# Patient Record
Sex: Male | Born: 1990 | Race: Black or African American | Hispanic: No | Marital: Single | Smoking: Current some day smoker
Health system: Southern US, Community
[De-identification: ages and names within clinical notes are randomized; demographics above are authoritative.]

## PROBLEM LIST (undated history)

## (undated) DIAGNOSIS — R569 Unspecified convulsions: Secondary | ICD-10-CM

---

## 2019-12-06 ENCOUNTER — Emergency Department: Payer: Self-pay

## 2019-12-06 ENCOUNTER — Encounter: Payer: Self-pay | Admitting: Emergency Medicine

## 2019-12-06 ENCOUNTER — Emergency Department
Admission: EM | Admit: 2019-12-06 | Discharge: 2019-12-06 | Disposition: A | Discharge status: Home / Self Care | Payer: Self-pay | Attending: Emergency Medicine | Admitting: Emergency Medicine

## 2019-12-06 ENCOUNTER — Other Ambulatory Visit: Payer: Self-pay

## 2019-12-06 DIAGNOSIS — X58XXXA Exposure to other specified factors, initial encounter: Secondary | ICD-10-CM | POA: Insufficient documentation

## 2019-12-06 DIAGNOSIS — Y999 Unspecified external cause status: Secondary | ICD-10-CM | POA: Insufficient documentation

## 2019-12-06 DIAGNOSIS — Y929 Unspecified place or not applicable: Secondary | ICD-10-CM | POA: Insufficient documentation

## 2019-12-06 DIAGNOSIS — Y9389 Activity, other specified: Secondary | ICD-10-CM | POA: Insufficient documentation

## 2019-12-06 DIAGNOSIS — S39012A Strain of muscle, fascia and tendon of lower back, initial encounter: Secondary | ICD-10-CM | POA: Insufficient documentation

## 2019-12-06 HISTORY — DX: Unspecified convulsions: R56.9

## 2019-12-06 MED ORDER — OXYCODONE-ACETAMINOPHEN 5-325 MG PO TABS: 1.0000 | ORAL_TABLET | Freq: Four times a day (QID) | ORAL | 0 refills | Status: DC | PRN

## 2019-12-06 MED ORDER — HYDROCODONE-ACETAMINOPHEN 5-325 MG PO TABS
1.0000 | ORAL_TABLET | Freq: Once | ORAL | Status: AC
  Administered 2019-12-06: 13:00:00 1 via ORAL
  Filled 2019-12-06: qty 1

## 2019-12-06 MED ORDER — METHOCARBAMOL 750 MG PO TABS: 750.0000 mg | ORAL_TABLET | Freq: Four times a day (QID) | ORAL | 0 refills | Status: DC

## 2019-12-06 MED ORDER — NAPROXEN 500 MG PO TABS: 500.0000 mg | ORAL_TABLET | Freq: Two times a day (BID) | ORAL | 0 refills | Status: DC

## 2019-12-06 MED ORDER — KETOROLAC TROMETHAMINE 30 MG/ML IJ SOLN
30.0000 mg | Freq: Once | INTRAMUSCULAR | Status: AC
  Administered 2019-12-06: 30 mg via INTRAMUSCULAR
  Filled 2019-12-06: qty 1

## 2019-12-06 MED ORDER — METHOCARBAMOL 500 MG PO TABS
1000.0000 mg | ORAL_TABLET | Freq: Once | ORAL | Status: AC
  Administered 2019-12-06: 1000 mg via ORAL
  Filled 2019-12-06: qty 2

## 2019-12-06 NOTE — ED Provider Notes (Signed)
[facility] Emergency Department Provider Note  ___________________________________________   First MD Initiated Contact with Patient 12/06/19 1133     (approximate)  I have reviewed the triage vital signs and the nursing notes.   HISTORY  Chief Complaint Back Pain  HPI Ryan Randolph is a 29 y.o. male presents to the ED with complaint of low back pain.  Patient states that he was at work and sneeze when his back suddenly began hurting.  He reports  previous problems with his back but no surgery.  He has not taken any over-the-counter medication.  He denies any saddle anesthesias or l incontinence of bowel or bladder.  Patient has continued to ambulate without any assistance.  Patient rates his pain as a 9 out of 10.     Past Medical History:  Diagnosis Date  . Seizures (HCC)     There are no problems to display for this patient.   History reviewed. No pertinent surgical history.  Prior to Admission medications   Medication Sig Start Date End Date Taking? Authorizing Provider  methocarbamol (ROBAXIN-750) 750 MG tablet Take 1 tablet (750 mg total) by mouth 4 (four) times daily. 12/06/19   Tommi Rumps, PA-C  naproxen (NAPROSYN) 500 MG tablet Take 1 tablet (500 mg total) by mouth 2 (two) times daily with a meal. 12/06/19   Tommi Rumps, PA-C  oxyCODONE-acetaminophen (PERCOCET) 5-325 MG tablet Take 1 tablet by mouth every 6 (six) hours as needed for severe pain. 12/06/19   Tommi Rumps, PA-C    Allergies Patient has no allergy information on record.  No family history on file.  Social History Social History   Tobacco Use  . Smoking status: Not on file  Substance Use Topics  . Alcohol use: Not on file  . Drug use: Not on file    Review of Systems Constitutional: No fever/chills Cardiovascular: Denies chest pain. Respiratory: Denies shortness of breath. Gastrointestinal: No abdominal pain.  No nausea, no vomiting. Genitourinary:  Negative for dysuria. Musculoskeletal: Positive for low back pain.  Negative for previous back surgeries. Skin: Negative for rash. Neurological: Negative for headaches, focal weakness or numbness.  ____________________________________________   PHYSICAL EXAM:  VITAL SIGNS: ED Triage Vitals  Enc Vitals Group     BP 12/06/19 1042 133/79     Pulse Rate 12/06/19 1042 63     Resp 12/06/19 1042 20     Temp 12/06/19 1042 97.8 F (36.6 C)     Temp Source 12/06/19 1042 Oral     SpO2 12/06/19 1042 100 %     Weight 12/06/19 1044 260 lb (117.9 kg)     Height 12/06/19 1044 5\' 9"  (1.753 m)     Head Circumference --      Peak Flow --      Pain Score 12/06/19 1044 9     Pain Loc --      Pain Edu? --      Excl. in GC? --     Constitutional: Alert and oriented. Well appearing and in no acute distress. Eyes: Conjunctivae are normal.  Head: Atraumatic. Neck: No stridor.   Cardiovascular: Normal rate, regular rhythm. Grossly normal heart sounds.  Good peripheral circulation. Respiratory: Normal respiratory effort.  No retractions. Lungs CTAB. Musculoskeletal: On examination of the back there is no gross deformity.  There is diffuse tenderness on palpation of the lower lumbar and sacral area bilaterally.  Range of motion is restricted secondary to discomfort.  Good muscle  strength bilaterally.  Straight leg raises were difficult secondary to discomfort.  Patient is able to weight-bear and ambulate without any assistance. Neurologic:  Normal speech and language. No gross focal neurologic deficits are appreciated. No gait instability. Skin:  Skin is warm, dry and intact. No rash noted.  No skin discoloration. Psychiatric: Mood and affect are normal. Speech and behavior are normal.  ____________________________________________   LABS (all labs ordered are listed, but only abnormal results are displayed)  Labs Reviewed - No data to display  RADIOLOGY  Official radiology report(s): DG Lumbar  Spine 2-3 Views  Result Date: 12/06/2019 CLINICAL DATA:  Pain EXAM: LUMBAR SPINE - 2-3 VIEW COMPARISON:  None. FINDINGS: There is no evidence of lumbar spine fracture. Mild disc space height loss and osteophytosis at L4-L5 and L5-S1, with otherwise preserved disc spaces. Intervertebral disc spaces are maintained. IMPRESSION: No fracture or dislocation of the lumbar spine. Mild disc space height loss and osteophytosis at L4-L5 and L5-S1, with otherwise preserved disc spaces. Lumbar disc and neural foraminal pathology may be further evaluated by MRI if indicated by localizing signs and symptoms. Electronically Signed   By: Eddie Candle M.D.   On: 12/06/2019 14:09    ____________________________________________   PROCEDURES  Procedure(s) performed (including Critical Care):  Procedures   ____________________________________________   INITIAL IMPRESSION / ASSESSMENT AND PLAN / ED COURSE  As part of my medical decision making, I reviewed the following data within the electronic MEDICAL RECORD NUMBER Notes from prior ED visits and [place] Controlled Substance Database  29 year old male presents to the ED with complaint of lower back pain after he sneezed at work today.  Patient patient states he has had problems with his low back in the past but has not had any surgeries or orthopedic care.  Patient continues to ambulate without any assistance.  Physical exam was consistent with a muscle skeletal strain.  Patient was given Toradol 30 mg IM, Robaxin 1000 mg p.o. and Norco 5/325 1 p.o. while in the ED.  X-rays were negative for any acute changes.  Patient was discharged with a prescription for methocarbamol 750 mg 1 tablet 4 times daily, naproxen 500 mg twice daily and Percocet 5/325.  Patient is to use ice or heat to his back as needed for discomfort.  He is to follow-up with Dr. Posey Pronto if any continued problems.  ____________________________________________   FINAL CLINICAL IMPRESSION(S) / ED  DIAGNOSES  Final diagnoses:  Strain of lumbar region, initial encounter     ED Discharge Orders         Ordered    oxyCODONE-acetaminophen (PERCOCET) 5-325 MG tablet  Every 6 hours PRN     12/06/19 1419    methocarbamol (ROBAXIN-750) 750 MG tablet  4 times daily     12/06/19 1419    naproxen (NAPROSYN) 500 MG tablet  2 times daily with meals     12/06/19 1419           Note:  This document was prepared using Dragon voice recognition software and may include unintentional dictation errors.    Johnn Hai, PA-C 12/06/19 1700    Duffy Bruce, MD 12/06/19 2030

## 2019-12-06 NOTE — Discharge Instructions (Signed)
Follow-up with 1 the clinics listed on your discharge papers.  Begin taking medication as directed.  You had the first medications in the ED.  Be aware that both medications may cause drowsiness and increase your risk for fall or injury.  Also Dr. Allena Katz is listed on your discharge papers who is the orthopedist on call if you are not improving.  You may use ice or heat to your muscles as needed for discomfort.

## 2019-12-06 NOTE — ED Notes (Signed)
Says he was at work and sneezed and then his back hurt. Low back all the way across without radiation.

## 2019-12-06 NOTE — ED Triage Notes (Signed)
Pt reports was at work, sneezed and hurt his lower back when he did. Pt denies loss of bowel or bladder. Pt does reports a hx of back pain.

## 2020-03-05 ENCOUNTER — Telehealth: Payer: Self-pay | Admitting: General Practice

## 2020-03-05 NOTE — Telephone Encounter (Signed)
Individual has been contacted 3+ times regarding ED referral. No further attempts to contact individual will be made. 

## 2021-05-04 ENCOUNTER — Emergency Department
Admission: EM | Admit: 2021-05-04 | Discharge: 2021-05-04 | Disposition: A | Discharge status: Home / Self Care | Payer: Self-pay | Attending: Emergency Medicine | Admitting: Emergency Medicine

## 2021-05-04 ENCOUNTER — Other Ambulatory Visit: Payer: Self-pay

## 2021-05-04 DIAGNOSIS — M79605 Pain in left leg: Secondary | ICD-10-CM | POA: Insufficient documentation

## 2021-05-04 DIAGNOSIS — Z5321 Procedure and treatment not carried out due to patient leaving prior to being seen by health care provider: Secondary | ICD-10-CM | POA: Insufficient documentation

## 2021-05-04 NOTE — ED Triage Notes (Signed)
Pt comes pov with left leg pain. States he gets pinched nerves in his legs sometimes from discs in his back.

## 2021-05-15 ENCOUNTER — Emergency Department: Payer: Self-pay

## 2021-05-15 ENCOUNTER — Emergency Department
Admission: EM | Admit: 2021-05-15 | Discharge: 2021-05-15 | Disposition: A | Discharge status: Home / Self Care | Payer: Self-pay | Attending: Emergency Medicine | Admitting: Emergency Medicine

## 2021-05-15 ENCOUNTER — Other Ambulatory Visit: Payer: Self-pay

## 2021-05-15 ENCOUNTER — Encounter: Payer: Self-pay | Admitting: Emergency Medicine

## 2021-05-15 DIAGNOSIS — M79605 Pain in left leg: Secondary | ICD-10-CM

## 2021-05-15 DIAGNOSIS — M79662 Pain in left lower leg: Secondary | ICD-10-CM | POA: Insufficient documentation

## 2021-05-15 MED ORDER — ONDANSETRON 4 MG PO TBDP
4.0000 mg | ORAL_TABLET | Freq: Once | ORAL | Status: AC
  Administered 2021-05-15: 4 mg via ORAL
  Filled 2021-05-15: qty 1

## 2021-05-15 MED ORDER — IBUPROFEN 800 MG PO TABS: 800.0000 mg | ORAL_TABLET | Freq: Three times a day (TID) | ORAL | 0 refills | Status: DC | PRN

## 2021-05-15 MED ORDER — OXYCODONE-ACETAMINOPHEN 5-325 MG PO TABS
2.0000 | ORAL_TABLET | Freq: Once | ORAL | Status: AC
  Administered 2021-05-15: 2 via ORAL
  Filled 2021-05-15: qty 2

## 2021-05-15 MED ORDER — PREDNISONE 10 MG (21) PO TBPK: ORAL_TABLET | ORAL | 0 refills | Status: DC

## 2021-05-15 MED ORDER — ONDANSETRON 4 MG PO TBDP: 4.0000 mg | ORAL_TABLET | Freq: Four times a day (QID) | ORAL | 0 refills | Status: DC | PRN

## 2021-05-15 MED ORDER — OXYCODONE-ACETAMINOPHEN 5-325 MG PO TABS: 1.0000 | ORAL_TABLET | Freq: Four times a day (QID) | ORAL | 0 refills | Status: DC | PRN

## 2021-05-15 NOTE — ED Provider Notes (Signed)
[facility] Emergency Department Provider Note  ____________________________________________   Event Date/Time   First MD Initiated Contact with Patient 05/15/21 [id number]     (approximate)  I have reviewed the triage vital signs and the nursing notes.   HISTORY  Chief Complaint Leg Pain    HPI Ryan Randolph is a 30 y.o. male with history of seizures who presents to the emergency department complaints of left calf pain that is sharp, severe in nature for the past 2 weeks.  States is just progressively getting worse.  No injury that he can recall.  States pain is worse with walking.  Denies numbness, tingling or weakness.  No fever.  No chest pain or shortness of breath.  No bowel or bladder incontinence.  No urinary retention.  No back pain.  No history of PE or DVT.  Has had a previous "pinched nerve" in his back but states this feels different.  Does not have a primary care provider.  Has tried Tylenol at home without relief.         Past Medical History:  Diagnosis Date   Seizures (HCC)     There are no problems to display for this patient.   History reviewed. No pertinent surgical history.  Prior to Admission medications   Medication Sig Start Date End Date Taking? Authorizing Provider  ibuprofen (ADVIL) 800 MG tablet Take 1 tablet (800 mg total) by mouth every 8 (eight) hours as needed for mild pain. 05/15/21  Yes Tallie Dodds, Baxter Hire N, DO  ondansetron (ZOFRAN ODT) 4 MG disintegrating tablet Take 1 tablet (4 mg total) by mouth every 6 (six) hours as needed for nausea or vomiting. 05/15/21  Yes Mohamud Mrozek, Layla Maw, DO  oxyCODONE-acetaminophen (PERCOCET) 5-325 MG tablet Take 1 tablet by mouth every 6 (six) hours as needed for severe pain. 05/15/21 05/15/22 Yes Lukis Bunt, Layla Maw, DO  predniSONE (STERAPRED UNI-PAK 21 TAB) 10 MG (21) TBPK tablet Take as directed 05/15/21  Yes Hend Mccarrell, Layla Maw, DO    Allergies Patient has no known allergies.  No family history on  file.  Social History    Review of Systems Constitutional: No fever. Eyes: No visual changes. ENT: No sore throat. Cardiovascular: Denies chest pain. Respiratory: Denies shortness of breath. Gastrointestinal: No nausea, vomiting, diarrhea. Genitourinary: Negative for dysuria. Musculoskeletal: Negative for back pain. Skin: Negative for rash. Neurological: Negative for focal weakness or numbness.  ____________________________________________   PHYSICAL EXAM:  VITAL SIGNS: ED Triage Vitals  Enc Vitals Group     BP 05/15/21 0401 (!) 133/91     Pulse Rate 05/15/21 0401 82     Resp 05/15/21 0401 18     Temp 05/15/21 0401 98.5 F (36.9 C)     Temp Source 05/15/21 0401 Oral     SpO2 05/15/21 0401 97 %     Weight 05/15/21 0402 230 lb (104.3 kg)     Height 05/15/21 0402 5\' 9"  (1.753 m)     Head Circumference --      Peak Flow --      Pain Score 05/15/21 0401 10     Pain Loc --      Pain Edu? --      Excl. in GC? --    CONSTITUTIONAL: Alert and oriented and responds appropriately to questions. Well-appearing; well-nourished HEAD: Normocephalic EYES: Conjunctivae clear, pupils appear equal, EOM appear intact ENT: normal nose; moist mucous membranes NECK: Supple, normal ROM CARD: RRR; S1 and S2 appreciated; no murmurs, no clicks,  no rubs, no gallops RESP: Normal chest excursion without splinting or tachypnea; breath sounds clear and equal bilaterally; no wheezes, no rhonchi, no rales, no hypoxia or respiratory distress, speaking full sentences ABD/GI: Normal bowel sounds; non-distended; soft, non-tender, no rebound, no guarding, no peritoneal signs, no hepatosplenomegaly BACK: The back appears normal, no midline spinal tenderness or step-off or deformity EXT: Patient is tender to palpation in the left posterior calf without swelling.  2+ DP pulses bilaterally.  No joint effusion, redness or warmth noted.  Compartments soft.  No tenderness over the left hip, knee, ankle or  foot. SKIN: Normal color for age and race; warm; no rash on exposed skin NEURO: Moves all extremities equally PSYCH: The patient's mood and manner are appropriate.  ____________________________________________   LABS (all labs ordered are listed, but only abnormal results are displayed)  Labs Reviewed - No data to display ____________________________________________  EKG   ____________________________________________  RADIOLOGY I, Saysha Menta, personally viewed and evaluated these images (plain radiographs) as part of my medical decision making, as well as reviewing the written report by the radiologist.  ED MD interpretation: No DVT in the left lower extremity.  Official radiology report(s): US Venous Img Lower Unilateral Left  Result Date: 05/15/2021 CLINICAL DATA:  30 year old male with several weeks of left calf pain. No known injury. EXAM: LEFT LOWER EXTREMITY VENOUS DOPPLER ULTRASOUND TECHNIQUE: Gray-scale sonography with compression, as well as color and duplex ultrasound, were performed to evaluate the deep venous system(s) from the level of the common femoral vein through the popliteal and proximal calf veins. COMPARISON:  None. FINDINGS: VENOUS Normal compressibility of the common femoral, superficial femoral, and popliteal veins, as well as the visualized calf veins. Visualized portions of profunda femoral vein and great saphenous vein unremarkable. No filling defects to suggest DVT on grayscale or color Doppler imaging. Doppler waveforms show normal direction of venous flow, normal respiratory plasticity and response to augmentation. Limited views of the contralateral common femoral vein are unremarkable. OTHER None. Limitations: none IMPRESSION: No evidence of left lower extremity deep venous thrombosis. Electronically Signed   By: Odessa Fleming M.D.   On: 05/15/2021 06:01    ____________________________________________   PROCEDURES  Procedure(s) performed (including Critical  Care):  Procedures    ____________________________________________   INITIAL IMPRESSION / ASSESSMENT AND PLAN / ED COURSE  As part of my medical decision making, I reviewed the following data within the electronic MEDICAL RECORD NUMBER Nursing notes reviewed and incorporated, Old chart reviewed, ultrasound reviewed, Notes from prior ED visits, and [place] Controlled Substance Database         Patient here complaints of 2 weeks of left calf pain.  Differential includes DVT, superficial thrombophlebitis, radiculopathy, muscle strain.  No sign of septic arthritis, gout, compartment syndrome, cellulitis, fracture, arterial obstruction on exam.  He is neurologically intact.  Will obtain venous Doppler and give pain medication.  He denies having any chest pain or shortness of breath.  He has history of radiculopathy before and this could be related again today but he denies having any back pain.  ED PROGRESS  Venous Doppler shows no acute abnormality.  Discussed with patient that this could be a muscle strain but there is always a possibility that this is radiculopathy.  He has no red flag symptoms to suggest cauda equina, epidural abscess or hematoma, discitis or osteomyelitis, transverse myelitis.  Will discharge with pain medication as well as anti-inflammatories and a steroid taper.  I have encouraged him to establish care  with a primary care provider to further investigate and monitor these symptoms.  Discussed return precautions.  I feel he is safe to be discharged home.  At this time, I do not feel there is any life-threatening condition present. I have reviewed, interpreted and discussed all results (EKG, imaging, lab, urine as appropriate) and exam findings with patient/family. I have reviewed nursing notes and appropriate previous records.  I feel the patient is safe to be discharged home without further emergent workup and can continue workup as an outpatient as needed. Discussed usual and  customary return precautions. Patient/family verbalize understanding and are comfortable with this plan.  Outpatient follow-up has been provided as needed. All questions have been answered.  ____________________________________________   FINAL CLINICAL IMPRESSION(S) / ED DIAGNOSES  Final diagnoses:  Left leg pain     ED Discharge Orders          Ordered    predniSONE (STERAPRED UNI-PAK 21 TAB) 10 MG (21) TBPK tablet        05/15/21 0618    oxyCODONE-acetaminophen (PERCOCET) 5-325 MG tablet  Every 6 hours PRN        05/15/21 0618    ondansetron (ZOFRAN ODT) 4 MG disintegrating tablet  Every 6 hours PRN        05/15/21 0618    ibuprofen (ADVIL) 800 MG tablet  Every 8 hours PRN        05/15/21 0618            *Please note:  Nathian Stencil was evaluated in Emergency Department on 05/15/2021 for the symptoms described in the history of present illness. He was evaluated in the context of the global COVID-19 pandemic, which necessitated consideration that the patient might be at risk for infection with the SARS-CoV-2 virus that causes COVID-19. Institutional protocols and algorithms that pertain to the evaluation of patients at risk for COVID-19 are in a state of rapid change based on information released by regulatory bodies including the CDC and federal and state organizations. These policies and algorithms were followed during the patient's care in the ED.  Some ED evaluations and interventions may be delayed as a result of limited staffing during and the pandemic.*   Note:  This document was prepared using Dragon voice recognition software and may include unintentional dictation errors.    Clotee Schlicker, Layla Maw, DO 05/15/21 [phone]

## 2021-05-15 NOTE — ED Triage Notes (Signed)
EMS to lobby with pt via w/c with no distress noted; c/o left leg pain several wks with no injury; st hx of same and dx with pinched nerve

## 2021-05-15 NOTE — Discharge Instructions (Addendum)
The ultrasound of your left calf did not show any blood clot.  You have no sign of any infection or broken bone today.  This could be coming from your back called sciatica or radiculopathy.  We are starting you on pain medication to help with this but recommend very close follow-up with a primary care doctor if symptoms or not improving.  Steps to find a Primary Care Provider (PCP):  Call [phone] or [phone] to access "[place] Find a Doctor Service."  2.  You may also go on the [facility] website at [email or web address]

## 2021-06-18 ENCOUNTER — Encounter: Payer: Self-pay | Admitting: Emergency Medicine

## 2021-06-18 ENCOUNTER — Other Ambulatory Visit: Payer: Self-pay

## 2021-06-18 ENCOUNTER — Emergency Department
Admission: EM | Admit: 2021-06-18 | Discharge: 2021-06-18 | Disposition: A | Discharge status: Home / Self Care | Payer: Self-pay | Attending: Emergency Medicine | Admitting: Emergency Medicine

## 2021-06-18 DIAGNOSIS — Z5321 Procedure and treatment not carried out due to patient leaving prior to being seen by health care provider: Secondary | ICD-10-CM | POA: Insufficient documentation

## 2021-06-18 DIAGNOSIS — M549 Dorsalgia, unspecified: Secondary | ICD-10-CM | POA: Insufficient documentation

## 2021-06-18 DIAGNOSIS — M79605 Pain in left leg: Secondary | ICD-10-CM | POA: Insufficient documentation

## 2021-06-18 NOTE — ED Notes (Signed)
Called pt x3 to be roomed. No answer.

## 2021-06-18 NOTE — ED Triage Notes (Signed)
Pt to ED via POV c/o back pain and left leg pain. Pt states that he has had the pain for about 2 weeks but it keeps getting worse. Pt states that his whole leg is hurting. Pt is able to ambulate without difficulty. Pt is in NAD.

## 2021-06-20 ENCOUNTER — Emergency Department
Admission: EM | Admit: 2021-06-20 | Discharge: 2021-06-20 | Disposition: A | Discharge status: Home / Self Care | Payer: Self-pay | Attending: Student in an Organized Health Care Education/Training Program | Admitting: Student in an Organized Health Care Education/Training Program

## 2021-06-20 ENCOUNTER — Other Ambulatory Visit: Payer: Self-pay

## 2021-06-20 DIAGNOSIS — M5432 Sciatica, left side: Secondary | ICD-10-CM | POA: Insufficient documentation

## 2021-06-20 MED ORDER — KETOROLAC TROMETHAMINE 30 MG/ML IJ SOLN
30.0000 mg | Freq: Once | INTRAMUSCULAR | Status: AC
  Administered 2021-06-20: 30 mg via INTRAMUSCULAR
  Filled 2021-06-20: qty 1

## 2021-06-20 MED ORDER — PREDNISONE 10 MG PO TABS: ORAL_TABLET | ORAL | 0 refills | Status: DC

## 2021-06-20 NOTE — ED Triage Notes (Signed)
Pt in with co lower back pain that radiates to left leg. Pt has had the same in the past and told it was disc problem.

## 2021-06-20 NOTE — ED Notes (Signed)
See triage note  presents with lower back pain which radiates into left leg  states pain started about 1 month ago  was seen but states pain is improving  ambulates to room w/o diff

## 2021-06-20 NOTE — ED Provider Notes (Signed)
[facility] Emergency Department Provider Note  ____________________________________________   Event Date/Time   First MD Initiated Contact with Patient 06/20/21 [id number]     (approximate)  I have reviewed the triage vital signs and the nursing notes.   HISTORY  Chief Complaint Back Pain   HPI Ryan Randolph is a 30 y.o. male presents to the ED with complaint of low back pain with radiation to his left leg.  He denies any injury and states that he has had this problem before with the initial symptoms starting in 2014.  He denies any incontinence of bowel or bladder.  Patient has been taking over-the-counter medication with minimal relief.  Patient was seen September 2022 but did not follow-up with a primary care or specialist.  Rates pain as 10/10.       Past Medical History:  Diagnosis Date   Seizures (HCC)     There are no problems to display for this patient.   No past surgical history on file.  Prior to Admission medications   Medication Sig Start Date End Date Taking? Authorizing Provider  predniSONE (DELTASONE) 10 MG tablet Take 6 tablets  today, on day 2 take 5 tablets, day 3 take 4 tablets, day 4 take 3 tablets, day 5 take  2 tablets and 1 tablet the last day 06/20/21  Yes Tommi Rumps, PA-C    Allergies Patient has no known allergies.  No family history on file.  Social History    Review of Systems Constitutional: No fever/chills Eyes: No visual changes. ENT: No sore throat. Cardiovascular: Denies chest pain. Respiratory: Denies shortness of breath. Gastrointestinal: No abdominal pain.  No nausea, no vomiting.   Genitourinary: Negative for dysuria. Musculoskeletal: Positive low back pain.  Positive left leg radiculopathy. Skin: Negative for rash. Neurological: Negative for headaches, focal weakness or numbness. ____________________________________________   PHYSICAL EXAM:  VITAL SIGNS: ED Triage Vitals  Enc Vitals Group      BP 06/20/21 0631 137/87     Pulse Rate 06/20/21 0631 93     Resp 06/20/21 0631 17     Temp 06/20/21 0631 97.8 F (36.6 C)     Temp Source 06/20/21 0631 Oral     SpO2 06/20/21 0631 97 %     Weight 06/20/21 0630 230 lb (104.3 kg)     Height 06/20/21 0630 5\' 9"  (1.753 m)     Head Circumference --      Peak Flow --      Pain Score 06/20/21 0629 10     Pain Loc --      Pain Edu? --      Excl. in GC? --     Constitutional: Alert and oriented. Well appearing and in no acute distress.  Lying supine and in no acute distress at this time. Eyes: Conjunctivae are normal.  Head: Atraumatic. Neck: No stridor.   Cardiovascular: Normal rate, regular rhythm. Grossly normal heart sounds.  Good peripheral circulation. Respiratory: Normal respiratory effort.  No retractions. Lungs CTAB. Gastrointestinal: Soft and nontender. No distention.  Musculoskeletal: Minimal tenderness is noted on palpation of the lower lumbar spine.  Range of motion is slow and guarded from supine to sitting position.  No point tenderness is noted on palpation of the SI joint on the right.  Left SI joint is mildly tender.  Straight leg raises are 90 degrees with discomfort bilaterally.  Muscle strength bilaterally. Neurologic:  Normal speech and language.  Reflexes were 2+ bilaterally.  No gross  focal neurologic deficits are appreciated. No gait instability. Skin:  Skin is warm, dry and intact. No rash noted. Psychiatric: Mood and affect are normal. Speech and behavior are normal.  ____________________________________________   LABS (all labs ordered are listed, but only abnormal results are displayed)  Labs Reviewed - No data to display ____________________________________________ ____________________________________________   PROCEDURES  Procedure(s) performed (including Critical Care):  Procedures   ____________________________________________   INITIAL IMPRESSION / ASSESSMENT AND PLAN / ED COURSE  As part  of my medical decision making, I reviewed the following data within the electronic MEDICAL RECORD NUMBER Notes from prior ED visits and [place] Controlled Substance Database  Controlled substance database was reviewed.  30 year old male presents to the ED with complaint of left leg radiculopathy.  Patient has experienced this in the past and is being seen in the ED before.  He denies any recent injury, urinary symptoms, incontinence of bowel or bladder.  Physical exam was consistent with left lower extremity pain due to sciatica.  Patient was given Toradol 30 mg IM while in the emergency department.  He will begin taking a taper dose of prednisone and follow-up with Dr. Hyacinth Meeker who is on-call for orthopedics if any continued problems.  ____________________________________________   FINAL CLINICAL IMPRESSION(S) / ED DIAGNOSES  Final diagnoses:  Sciatica of left side     ED Discharge Orders          Ordered    predniSONE (DELTASONE) 10 MG tablet        06/20/21 5053             Note:  This document was prepared using Dragon voice recognition software and may include unintentional dictation errors.    Tommi Rumps, PA-C 06/20/21 1410    Willy Eddy, MD 06/20/21 [phone]

## 2021-06-20 NOTE — Discharge Instructions (Addendum)
Follow-up with Dr. Hyacinth Meeker at [facility] if any continued problems or not improving.  Begin taking prednisone 6 tablets today and tapering down over the next 6 days.  You may take Tylenol with this medication if additional pain medication is needed.  Ice or heat to your lower back and leg as needed for discomfort.  Physical therapy may also help with your symptoms and should be considered.

## 2022-09-01 ENCOUNTER — Emergency Department
Admission: EM | Admit: 2022-09-01 | Discharge: 2022-09-01 | Disposition: A | Discharge status: Home / Self Care | Payer: Self-pay | Attending: Emergency Medicine | Admitting: Emergency Medicine

## 2022-09-01 ENCOUNTER — Emergency Department: Payer: Self-pay

## 2022-09-01 ENCOUNTER — Other Ambulatory Visit: Payer: Self-pay

## 2022-09-01 DIAGNOSIS — M5430 Sciatica, unspecified side: Secondary | ICD-10-CM | POA: Insufficient documentation

## 2022-09-01 DIAGNOSIS — M79662 Pain in left lower leg: Secondary | ICD-10-CM | POA: Insufficient documentation

## 2022-09-01 MED ORDER — GABAPENTIN 300 MG PO CAPS: 300.0000 mg | ORAL_CAPSULE | Freq: Three times a day (TID) | ORAL | 2 refills | Status: AC

## 2022-09-01 MED ORDER — BACLOFEN 10 MG PO TABS: 10.0000 mg | ORAL_TABLET | Freq: Three times a day (TID) | ORAL | 0 refills | Status: AC

## 2022-09-01 MED ORDER — OXYCODONE-ACETAMINOPHEN 5-325 MG PO TABS
1.0000 | ORAL_TABLET | Freq: Once | ORAL | Status: AC
  Administered 2022-09-01: 1 via ORAL
  Filled 2022-09-01: qty 1

## 2022-09-01 MED ORDER — PREDNISONE 10 MG (21) PO TBPK: ORAL_TABLET | ORAL | 0 refills | Status: AC

## 2022-09-01 NOTE — ED Notes (Signed)
See triage note  Presents with pain to left leg  Denies any injury  Pain started about 1 week ago   Increased pain with standing

## 2022-09-01 NOTE — ED Provider Notes (Signed)
[facility] Provider Note    Event Date/Time   First MD Initiated Contact with Patient 09/01/22 0957     (approximate)   History   Leg Pain (Left)   HPI  Ryan Randolph is a 32 y.o. male history of seizures presents emergency department complaining of leg pain.  Patient states he thinks is coming from his back.  Has difficulty standing but no difficulty with range of motion of his back.  Denies numbness tingling.      Physical Exam   Triage Vital Signs: ED Triage Vitals  Enc Vitals Group     BP 09/01/22 0942 (!) 140/80     Pulse Rate 09/01/22 0942 80     Resp 09/01/22 0942 20     Temp 09/01/22 0942 98 F (36.7 C)     Temp Source 09/01/22 0942 Oral     SpO2 09/01/22 0942 99 %     Weight 09/01/22 0941 229 lb 15 oz (104.3 kg)     Height 09/01/22 0941 5\' 9"  (1.753 m)     Head Circumference --      Peak Flow --      Pain Score 09/01/22 0914 10     Pain Loc --      Pain Edu? --      Excl. in [place]? --     Most recent vital signs: Vitals:   09/01/22 0942  BP: (!) 140/80  Pulse: 80  Resp: 20  Temp: 98 F (36.7 C)  SpO2: 99%     General: Awake, no distress.   CV:  Good peripheral perfusion. regular rate and  rhythm Resp:  Normal effort.  Abd:  No distention.   Other:  Left calf very tender to palpation, extends into the posterior thigh, patient has difficulty trying to stand,'s lumbar spine is nontender   ED Results / Procedures / Treatments   Labs (all labs ordered are listed, but only abnormal results are displayed) Labs Reviewed - No data to display   EKG     RADIOLOGY Ultrasound left lower extremity for DVT    PROCEDURES:   Procedures   MEDICATIONS ORDERED IN ED: Medications  oxyCODONE-acetaminophen (PERCOCET/ROXICET) 5-325 MG per tablet 1 tablet (1 tablet Oral Given 09/01/22 1101)     IMPRESSION / MDM / [place] / ED COURSE  I reviewed the triage vital signs and the nursing notes.                               Differential diagnosis includes, but is not limited to, DVT, muscle strain, sciatica  Patient's presentation is most consistent with acute complicated illness / injury requiring diagnostic workup.   Due to the tenderness on palpation of the left calf I do feel that we should do a ultrasound for DVT.  Ultrasound for DVT independently reviewed and interpreted by me as being negative for any acute abnormality  I did explain findings to the patient.  He has been given Percocet while here in the ED for pain.  He states it helped a little but not a lot.  States he would like to have gabapentin as he has used this before and it helps.  I agree with this thought process.  I will start him on Sterapred, gabapentin and baclofen.  He does not have insurance so I did encourage him to [facility] or [facility].  He is to call and try make an appointment so they can find him help.  He could also follow-up with orthopedics this time Mack Guise is on-call.  He is in agreement with treatment plan.  He was discharged stable condition.      FINAL CLINICAL IMPRESSION(S) / ED DIAGNOSES   Final diagnoses:  Sciatica, unspecified laterality     Rx / DC Orders   ED Discharge Orders          Ordered    predniSONE (STERAPRED UNI-PAK 21 TAB) 10 MG (21) TBPK tablet        09/01/22 1213    gabapentin (NEURONTIN) 300 MG capsule  3 times daily        09/01/22 1213    baclofen (LIORESAL) 10 MG tablet  3 times daily        09/01/22 1213             Note:  This document was prepared using Dragon voice recognition software and may include unintentional dictation errors.    Versie Starks, PA-C 09/01/22 1216    Lucillie Garfinkel, MD 09/02/22 [phone]

## 2022-09-01 NOTE — ED Triage Notes (Signed)
Pt to ED via [facility] from home. Pt brought to triage in wheelchair. Pt reports left leg pain x1 wk. Pt states pain has now made him unable to bear weight on left left. Pt reports hx of same and was given steroid shots with no relief.

## 2022-09-01 NOTE — Discharge Instructions (Signed)
Call the above listed clinics to see if anyone is taking new patients that could see you and help refer you into orthopedics or neurosurgery for your back.  These are free and reduced price clinics.  If you do not have insurance they can help you.  Return emergency department worsening

## 2023-01-21 IMAGING — US US EXTREM LOW VENOUS*L*
1 series · 14 of 24 positions shown · non-contrast
Comparison: None.

CLINICAL DATA: 30-year-old male with several weeks of left calf
pain. No known injury.

EXAM:
LEFT LOWER EXTREMITY VENOUS DOPPLER ULTRASOUND
TECHNIQUE: Gray-scale sonography with compression, as well as color and duplex
ultrasound, were performed to evaluate the deep venous system(s)
from the level of the common femoral vein through the popliteal and
proximal calf veins.

[Series 1: us venous img lower uni left (dvt) · portal-venous · 14 of 34 slices shown]
[im 1/34]
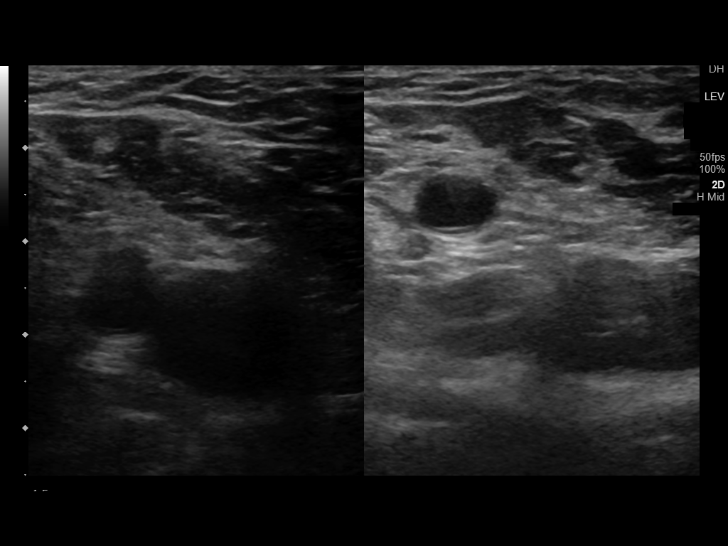
[im 3/34]
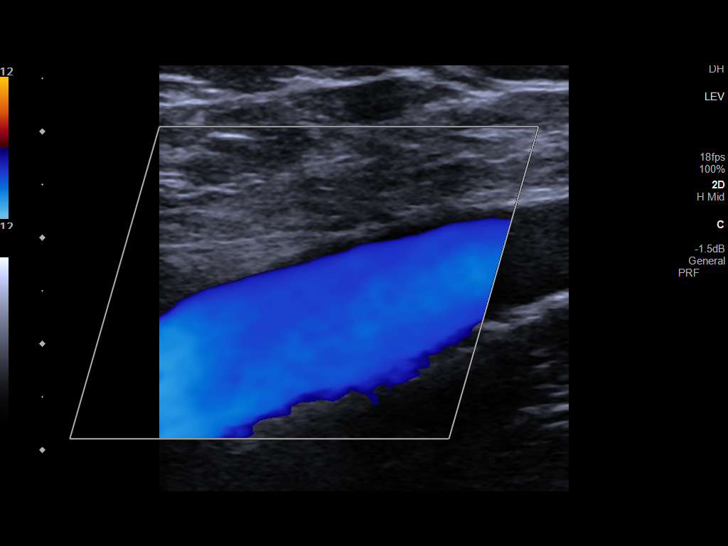
[im 6/34]
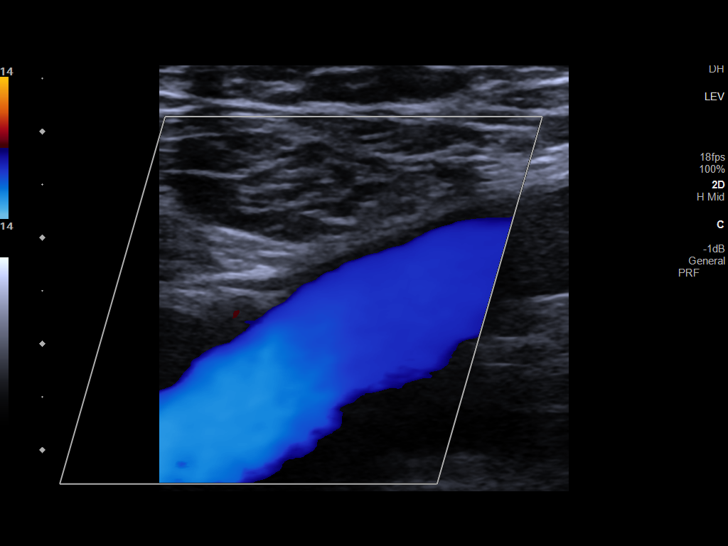
[im 9/34]
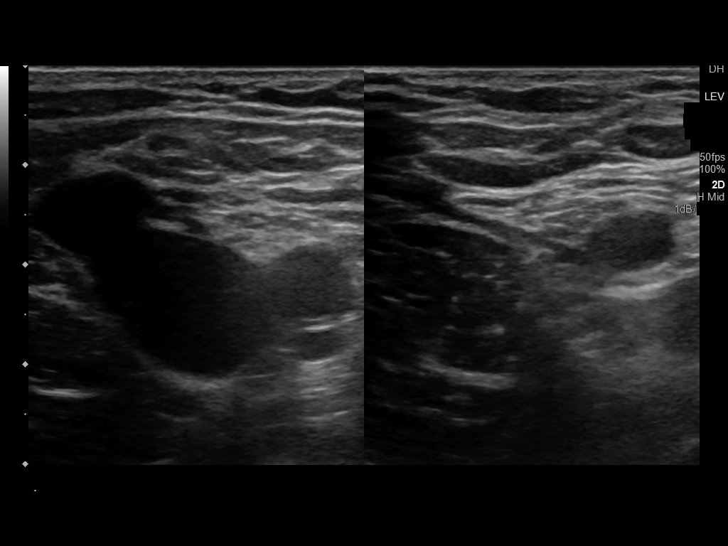
[im 11/34]
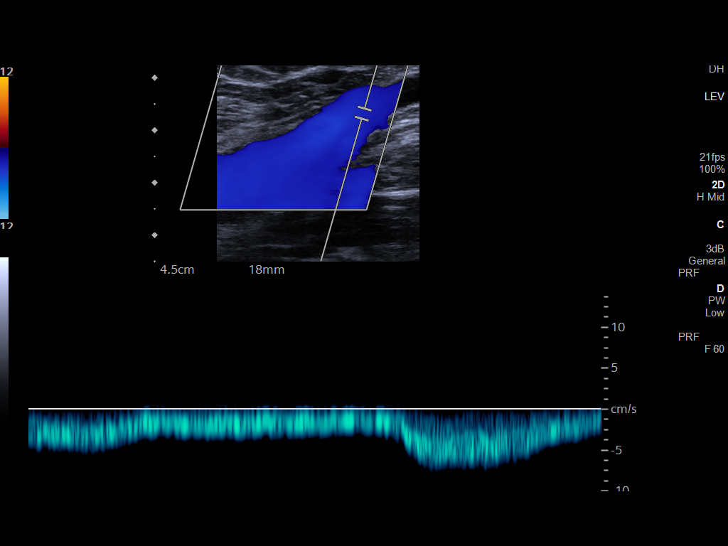
[im 13/34]
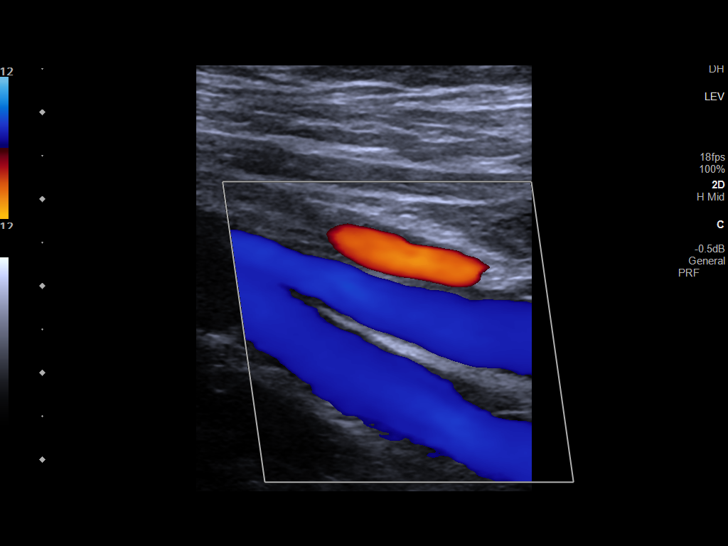
[im 16/34]
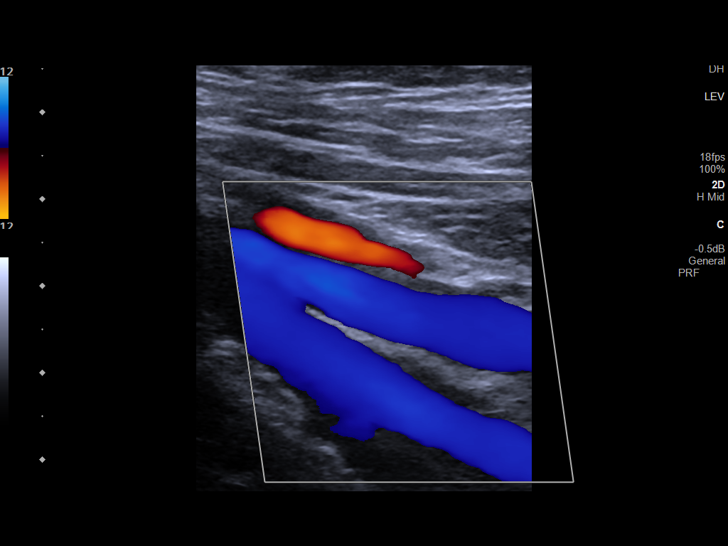
[im 18/34]
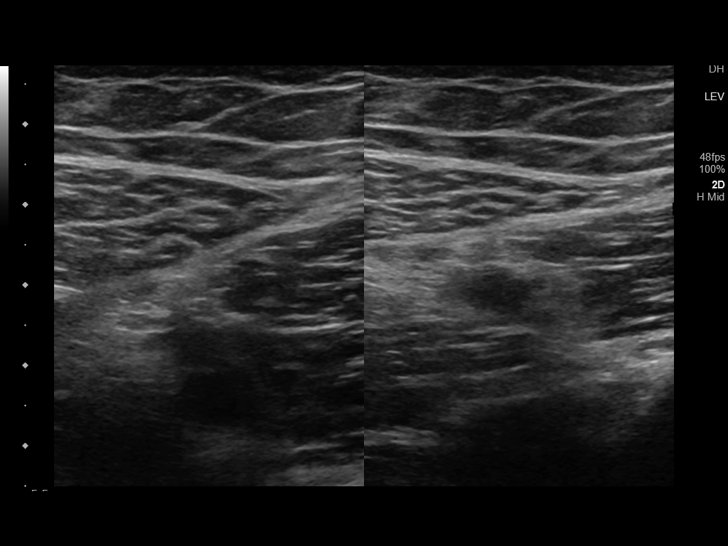
[im 21/34]
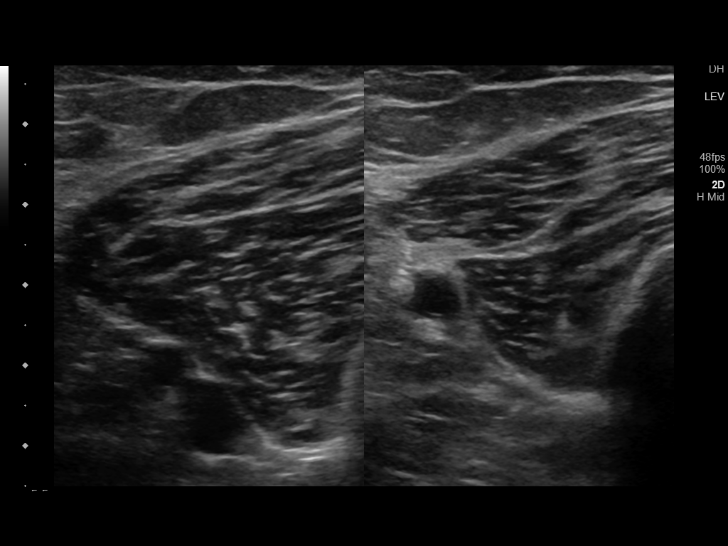
[im 23/34]
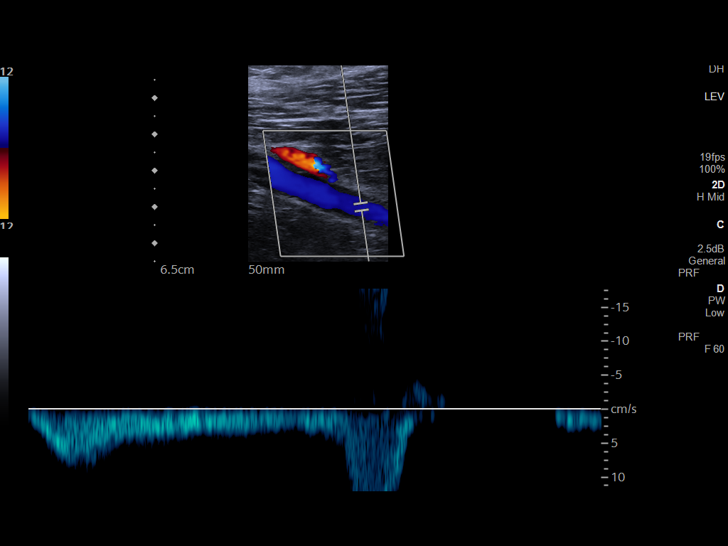
[im 26/34]
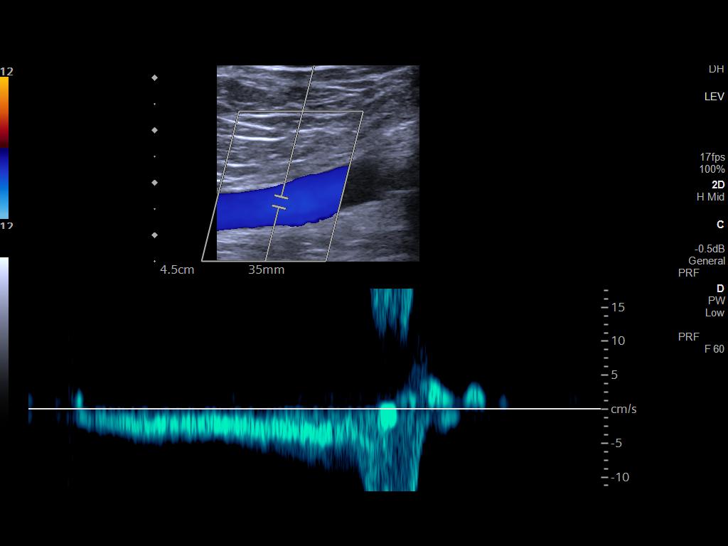
[im 28/34]
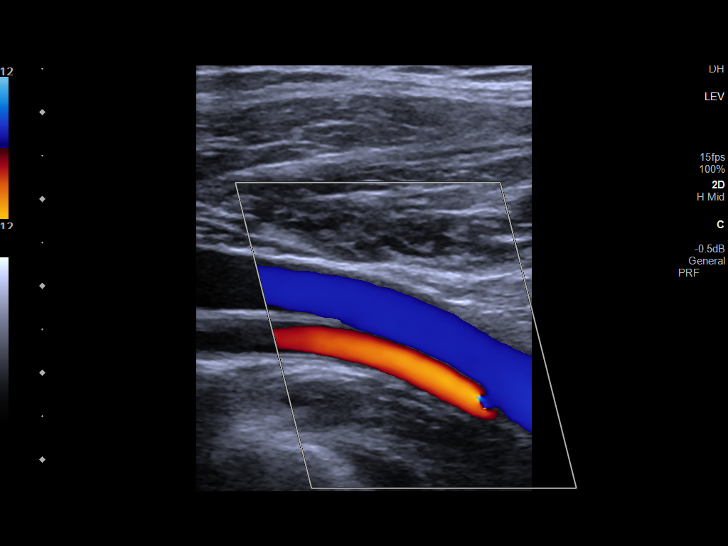
[im 31/34]
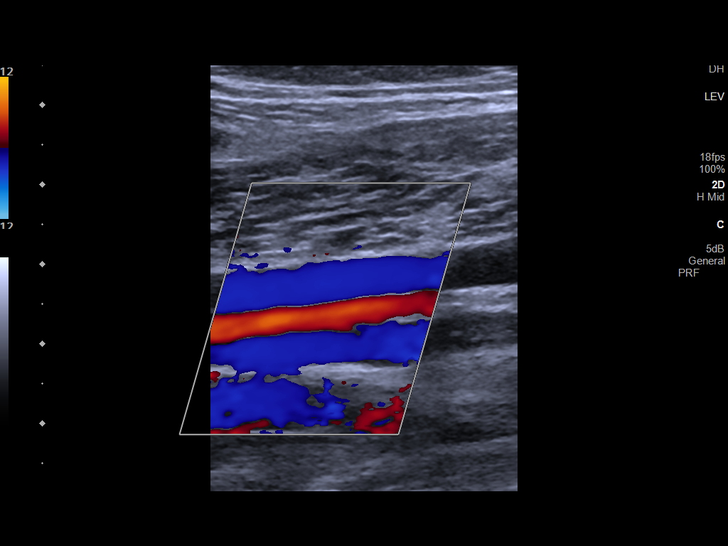
[im 34/34]
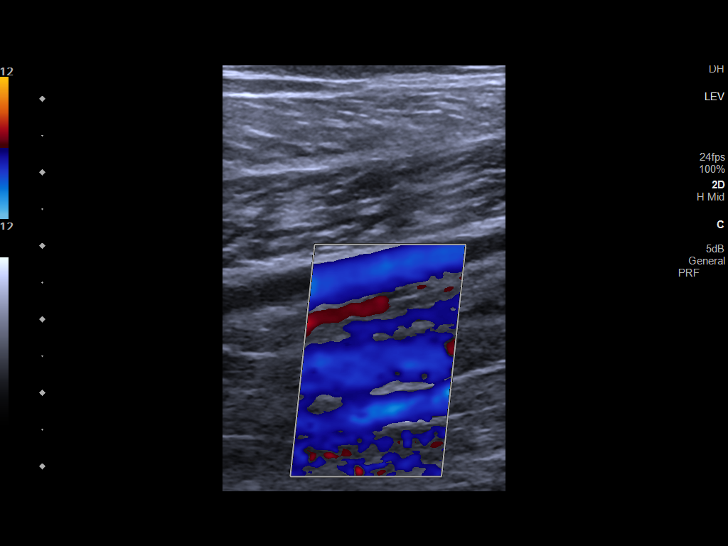

[14 of 24 positions shown; findings below may reference images not displayed]

FINDINGS: VENOUS

Normal compressibility of the common femoral, superficial femoral,
and popliteal veins, as well as the visualized calf veins.
Visualized portions of profunda femoral vein and great saphenous
vein unremarkable. No filling defects to suggest DVT on grayscale or
color Doppler imaging. Doppler waveforms show normal direction of
venous flow, normal respiratory plasticity and response to
augmentation.

Limited views of the contralateral common femoral vein are
unremarkable.

OTHER

None.

Limitations: none
IMPRESSION: No evidence of left lower extremity deep venous thrombosis.

## 2023-09-08 ENCOUNTER — Emergency Department
Admission: EM | Admit: 2023-09-08 | Discharge: 2023-09-09 | Discharge status: Against Medical Advice | Payer: Medicaid Other | Attending: Emergency Medicine | Admitting: Emergency Medicine

## 2023-09-08 ENCOUNTER — Encounter: Payer: Self-pay | Admitting: *Deleted

## 2023-09-08 ENCOUNTER — Other Ambulatory Visit: Payer: Self-pay

## 2023-09-08 DIAGNOSIS — R569 Unspecified convulsions: Secondary | ICD-10-CM | POA: Insufficient documentation

## 2023-09-08 DIAGNOSIS — R1110 Vomiting, unspecified: Secondary | ICD-10-CM | POA: Diagnosis not present

## 2023-09-08 DIAGNOSIS — Z5321 Procedure and treatment not carried out due to patient leaving prior to being seen by health care provider: Secondary | ICD-10-CM | POA: Insufficient documentation

## 2023-09-08 LAB — CBC WITH DIFFERENTIAL/PLATELET
Abs Immature Granulocytes: 0.02 10*3/uL (ref 0.00–0.07)
Basophils Absolute: 0 10*3/uL (ref 0.0–0.1)
Basophils Relative: 0 %
Eosinophils Absolute: 0 10*3/uL (ref 0.0–0.5)
Eosinophils Relative: 1 %
HCT: 52.5 % — ABNORMAL HIGH (ref 39.0–52.0)
Hemoglobin: 16.8 g/dL (ref 13.0–17.0)
Immature Granulocytes: 0 %
Lymphocytes Relative: 27 %
Lymphs Abs: 1.7 10*3/uL (ref 0.7–4.0)
MCH: 29.1 pg (ref 26.0–34.0)
MCHC: 32 g/dL (ref 30.0–36.0)
MCV: 91 fL (ref 80.0–100.0)
Monocytes Absolute: 0.5 10*3/uL (ref 0.1–1.0)
Monocytes Relative: 8 %
Neutro Abs: 3.9 10*3/uL (ref 1.7–7.7)
Neutrophils Relative %: 64 %
Platelets: 336 10*3/uL (ref 150–400)
RBC: 5.77 MIL/uL (ref 4.22–5.81)
RDW: 12.8 % (ref 11.5–15.5)
WBC: 6.1 10*3/uL (ref 4.0–10.5)
nRBC: 0 % (ref 0.0–0.2)

## 2023-09-08 LAB — COMPREHENSIVE METABOLIC PANEL
ALT: 30 U/L (ref 0–44)
AST: 24 U/L (ref 15–41)
Albumin: 3.7 g/dL (ref 3.5–5.0)
Alkaline Phosphatase: 48 U/L (ref 38–126)
Anion gap: 13 (ref 5–15)
BUN: 15 mg/dL (ref 6–20)
CO2: 24 mmol/L (ref 22–32)
Calcium: 8.5 mg/dL — ABNORMAL LOW (ref 8.9–10.3)
Chloride: 103 mmol/L (ref 98–111)
Creatinine, Ser: 1.32 mg/dL — ABNORMAL HIGH (ref 0.61–1.24)
GFR, Estimated: >60 mL/min (ref >60)
Glucose, Bld: 187 mg/dL — ABNORMAL HIGH (ref 70–99)
Potassium: 4.3 mmol/L (ref 3.5–5.1)
Sodium: 140 mmol/L (ref 135–145)
Total Bilirubin: 0.6 mg/dL (ref 0.0–1.2)
Total Protein: 6.7 g/dL (ref 6.5–8.1)

## 2023-09-08 LAB — TROPONIN I (HIGH SENSITIVITY): Troponin I (High Sensitivity): 6 ng/L (ref <18)

## 2023-09-08 NOTE — ED Triage Notes (Addendum)
Pt brought in via ems.  Pt was giving plasma and had 2 seizures.  Postictal on the scene per ems.  Pt alert on arrival to er.  No headache.  Pt vomited x 1.   Pt reports no seizure meds.  Pt alert and oriented in triage.  Pt denies neck or back pain.  Cbg per ems 150

## 2023-09-08 NOTE — ED Notes (Signed)
No answer when called several times from lobby 

## 2023-09-09 NOTE — ED Notes (Signed)
No answer when called several times from lobby 

## 2024-01-13 ENCOUNTER — Emergency Department
Admission: EM | Admit: 2024-01-13 | Discharge: 2024-01-13 | Disposition: A | Discharge status: Home / Self Care | Payer: Self-pay | Attending: Emergency Medicine | Admitting: Emergency Medicine

## 2024-01-13 ENCOUNTER — Other Ambulatory Visit: Payer: Self-pay

## 2024-01-13 DIAGNOSIS — R55 Syncope and collapse: Secondary | ICD-10-CM | POA: Insufficient documentation

## 2024-01-13 LAB — CBC
HCT: 52.8 % — ABNORMAL HIGH (ref 39.0–52.0)
Hemoglobin: 17.3 g/dL — ABNORMAL HIGH (ref 13.0–17.0)
MCH: 29.1 pg (ref 26.0–34.0)
MCHC: 32.8 g/dL (ref 30.0–36.0)
MCV: 88.7 fL (ref 80.0–100.0)
Platelets: 304 10*3/uL (ref 150–400)
RBC: 5.95 MIL/uL — ABNORMAL HIGH (ref 4.22–5.81)
RDW: 13 % (ref 11.5–15.5)
WBC: 4.8 10*3/uL (ref 4.0–10.5)
nRBC: 0 % (ref 0.0–0.2)

## 2024-01-13 LAB — COMPREHENSIVE METABOLIC PANEL WITH GFR
ALT: 54 U/L — ABNORMAL HIGH (ref 0–44)
AST: 40 U/L (ref 15–41)
Albumin: 3.9 g/dL (ref 3.5–5.0)
Alkaline Phosphatase: 45 U/L (ref 38–126)
Anion gap: 10 (ref 5–15)
BUN: 17 mg/dL (ref 6–20)
CO2: 25 mmol/L (ref 22–32)
Calcium: 9 mg/dL (ref 8.9–10.3)
Chloride: 103 mmol/L (ref 98–111)
Creatinine, Ser: 1.36 mg/dL — ABNORMAL HIGH (ref 0.61–1.24)
GFR, Estimated: >60 mL/min (ref >60)
Glucose, Bld: 187 mg/dL — ABNORMAL HIGH (ref 70–99)
Potassium: 3.8 mmol/L (ref 3.5–5.1)
Sodium: 138 mmol/L (ref 135–145)
Total Bilirubin: 0.7 mg/dL (ref 0.0–1.2)
Total Protein: 6.9 g/dL (ref 6.5–8.1)

## 2024-01-13 LAB — URINALYSIS, ROUTINE W REFLEX MICROSCOPIC
Bacteria, UA: NONE SEEN
Bilirubin Urine: NEGATIVE
Glucose, UA: NEGATIVE mg/dL
Hgb urine dipstick: NEGATIVE
Ketones, ur: NEGATIVE mg/dL
Leukocytes,Ua: NEGATIVE
Nitrite: NEGATIVE
Protein, ur: 30 mg/dL — AB
Specific Gravity, Urine: 1.025 (ref 1.005–1.030)
pH: 5 (ref 5.0–8.0)

## 2024-01-13 NOTE — ED Notes (Signed)
 See triage note   Presents s/p syncopal episode today  States this has not happened before  Feeling better at present

## 2024-01-13 NOTE — ED Provider Notes (Signed)
 [facility] Provider Note    Event Date/Time   First MD Initiated Contact with Patient 01/13/24 1844     (approximate)   History   Chief Complaint Loss of Consciousness   HPI  Ryan Randolph is a 33 y.o. male with past medical history of childhood seizures who presents to the ED complaining of syncope.  Patient reports that he had donated plasma earlier today, began to feel lightheaded and dizzy on the way home.  He states a sensation of warmth came over him and he felt like he was going to pass out in the car, but does not remember what happened next.  His girlfriend at bedside states that he lost consciousness once in the car and again after returning home, never fell or hit his head.  Patient denies any chest pain or shortness of breath with these episodes, states that he currently feels back to normal.  He does report similar episodes in the past after donating plasma.     Physical Exam   Triage Vital Signs: ED Triage Vitals  Encounter Vitals Group     BP 01/13/24 1633 98/80     Systolic BP Percentile --      Diastolic BP Percentile --      Pulse Rate 01/13/24 1633 97     Resp 01/13/24 1633 18     Temp 01/13/24 1633 98.5 F (36.9 C)     Temp Source 01/13/24 1633 Oral     SpO2 01/13/24 1633 96 %     Weight 01/13/24 1631 250 lb (113.4 kg)     Height 01/13/24 1631 5\' 9"  (1.753 m)     Head Circumference --      Peak Flow --      Pain Score 01/13/24 1631 0     Pain Loc --      Pain Education --      Exclude from Growth Chart --     Most recent vital signs: Vitals:   01/13/24 1633  BP: 98/80  Pulse: 97  Resp: 18  Temp: 98.5 F (36.9 C)  SpO2: 96%    Constitutional: Alert and oriented. Eyes: Conjunctivae are normal. Head: Atraumatic. Nose: No congestion/rhinnorhea. Mouth/Throat: Mucous membranes are moist.  Cardiovascular: Normal rate, regular rhythm. Grossly normal heart sounds.  2+ radial pulses bilaterally. Respiratory: Normal  respiratory effort.  No retractions. Lungs CTAB. Gastrointestinal: Soft and nontender. No distention. Musculoskeletal: No lower extremity tenderness nor edema.  Neurologic:  Normal speech and language. No gross focal neurologic deficits are appreciated.    ED Results / Procedures / Treatments   Labs (all labs ordered are listed, but only abnormal results are displayed) Labs Reviewed  COMPREHENSIVE METABOLIC PANEL WITH GFR - Abnormal; Notable for the following components:      Result Value   Glucose, Bld 187 (*)    Creatinine, Ser 1.36 (*)    ALT 54 (*)    All other components within normal limits  CBC - Abnormal; Notable for the following components:   RBC 5.95 (*)    Hemoglobin 17.3 (*)    HCT 52.8 (*)    All other components within normal limits  URINALYSIS, ROUTINE W REFLEX MICROSCOPIC - Abnormal; Notable for the following components:   Color, Urine YELLOW (*)    APPearance HAZY (*)    Protein, ur 30 (*)    All other components within normal limits  CBG MONITORING, ED     EKG  ED ECG REPORT I,  Twilla Galea, the attending physician, personally viewed and interpreted this ECG.   Date: 01/13/2024  EKG Time: 16:39  Rate: 101  Rhythm: sinus tachycardia  Axis: LAD  Intervals:none  ST&T Change: None  PROCEDURES:  Critical Care performed: No  Procedures   MEDICATIONS ORDERED IN ED: Medications - No data to display   IMPRESSION / MDM / ASSESSMENT AND PLAN / ED COURSE  I reviewed the triage vital signs and the nursing notes.                              33 y.o. male with past medical history of childhood seizures who presents to the ED complaining of 2 syncopal episodes on the way home from donating plasma.  Patient's presentation is most consistent with acute presentation with potential threat to life or bodily function.  Differential diagnosis includes, but is not limited to, arrhythmia, vasovagal episode, orthostatic hypotension, dehydration, electrolyte  abnormality, AKI, anemia.  Patient nontoxic-appearing and in no acute distress, vital signs are unremarkable.  EKG shows no evidence of arrhythmia or ischemia and I have low suspicion for cardiac etiology.  Suspect vasovagal episodes and labs are reassuring without significant anemia, leukocytosis, electrolyte abnormality, or AKI.  He does appear to have some hemoconcentration and dehydration may have been a factor.  Given resolution of his symptoms with reassuring workup, he is appropriate for discharge home with outpatient follow-up, referral provided to establish care with PCP.  He was counseled to return to the ED for new or worsening symptoms, patient agrees with plan.      FINAL CLINICAL IMPRESSION(S) / ED DIAGNOSES   Final diagnoses:  Syncope and collapse     Rx / DC Orders   ED Discharge Orders          Ordered    Ambulatory Referral to Primary Care (Establish Care)        01/13/24 1904             Note:  This document was prepared using Dragon voice recognition software and may include unintentional dictation errors.   Twilla Galea, MD 01/13/24 1919

## 2024-01-13 NOTE — ED Notes (Signed)
 Pt departed before receiving discharge instructions or final set of vitals.

## 2024-01-13 NOTE — ED Triage Notes (Signed)
 Patient states syncopal episode after donating plasma; states this has happened before.
